# Patient Record
Sex: Male | Born: 1980
Health system: Southern US, Community
[De-identification: ages and names within clinical notes are randomized; demographics above are authoritative.]

## PROBLEM LIST (undated history)

## (undated) DIAGNOSIS — K219 Gastro-esophageal reflux disease without esophagitis: Secondary | ICD-10-CM

---

## 2002-04-22 ENCOUNTER — Ambulatory Visit (HOSPITAL_COMMUNITY): Admission: RE | Admit: 2002-04-22 | Discharge: 2002-04-22 | Payer: Self-pay | Admitting: Family Medicine

## 2002-04-22 ENCOUNTER — Encounter: Payer: Self-pay | Admitting: Family Medicine

## 2002-09-23 ENCOUNTER — Encounter: Payer: Self-pay | Admitting: Family Medicine

## 2002-09-23 ENCOUNTER — Ambulatory Visit (HOSPITAL_COMMUNITY): Admission: RE | Admit: 2002-09-23 | Discharge: 2002-09-23 | Payer: Self-pay | Admitting: Family Medicine

## 2003-12-13 ENCOUNTER — Ambulatory Visit (HOSPITAL_COMMUNITY): Admission: RE | Admit: 2003-12-13 | Discharge: 2003-12-13 | Payer: Self-pay | Admitting: General Surgery

## 2006-04-09 ENCOUNTER — Encounter (HOSPITAL_COMMUNITY): Admission: RE | Admit: 2006-04-09 | Discharge: 2006-05-09 | Payer: Self-pay | Admitting: Surgery

## 2006-04-09 ENCOUNTER — Encounter: Admission: RE | Admit: 2006-04-09 | Discharge: 2006-07-08 | Payer: Self-pay | Admitting: Surgery

## 2006-12-14 ENCOUNTER — Encounter: Admission: RE | Admit: 2006-12-14 | Discharge: 2007-03-14 | Payer: Self-pay | Admitting: Surgery

## 2006-12-16 HISTORY — PX: LAPAROSCOPIC GASTRIC BYPASS: SUR771

## 2007-01-04 ENCOUNTER — Inpatient Hospital Stay (HOSPITAL_COMMUNITY): Admission: RE | Admit: 2007-01-04 | Discharge: 2007-01-06 | Payer: Self-pay | Admitting: Surgery

## 2007-01-04 ENCOUNTER — Ambulatory Visit: Payer: Self-pay | Admitting: Surgery

## 2007-02-24 ENCOUNTER — Encounter: Admission: RE | Admit: 2007-02-24 | Discharge: 2007-02-25 | Payer: Self-pay | Admitting: Surgery

## 2010-07-30 NOTE — Op Note (Signed)
NAMECAMDEN, [name] NO.:  [id number]   MEDICAL RECORD NO.:  [id number]          PATIENT TYPE:  INP   LOCATION:  1525                         FACILITY:  [facility]   PHYSICIAN:  Sandria Bales. Ezzard Standing, M.D.  DATE OF BIRTH:  July 11, 1980   DATE OF PROCEDURE:  01/04/2007  DATE OF DISCHARGE:                               OPERATIVE REPORT   PREOPERATIVE DIAGNOSIS:  Morbid obesity weight 258, BMI of 50.   POSTOPERATIVE DIAGNOSIS:  Morbid obesity weight 258, BMI of 50.   PROCEDURES:  Laparoscopic Roux-en-Y gastric bypass.   SURGEON:  Dr. Ezzard Standing.   FIRST ASSISTANT:  Sharlet Salina T. Hoxworth, M.D.   ANESTHESIA:  General endotracheal.   ESTIMATED BLOOD LOSS:  Minimal.   INDICATIONS FOR PROCEDURE:  [name] is a 30 year old white male  who initially saw me earlier this year.  His initial weight was 396 with  a BMI of 55.5.  I told him he needed to lose 25-30 pounds before being  considered for surgery.  He was able to get his weight down to 358 with  a BMI of 50.1 and now comes for attempted laparoscopic Roux-en-Y gastric  bypass.  His primary care doctor is Dr. Assunta Found.   I have discussed with him the indications, potential complications  surgery.  Potential complications include but not limited to bleeding,  infection, bowel leak, deep venous thrombosis/pulmonary embolism and  long-term nutritional consequences.   I think he understands the procedure.   OPERATIVE NOTE:  Patient placed in a supine position, given general  endotracheal anesthetic.  He had a Foley catheter in place.  His abdomen  was shaved, prepped with Betadine solution and sterilely draped.  He is  given a gram of cefoxitin initiation procedure.   I accessed the abdominal cavity with an Ethicon OptiVue in the left  upper quadrant and placed a 12-mm OptiVue into the abdomen that  difficulty.  I then place six additional trocars of 5 mm of the  subxiphoid location, the liver retractor, a 12-mm right  subcostal, a 12  mm right paramedian trocar, a 12 mm left paramedian trocar and a 5 mm  left lateral left subcostal trocar and then the right 10 mm right to the  umbilicus.   Abdominal exploration was carried out.  Right left lobes of liver  unremarkable.  The anterior wall of the stomach that I could see was  unremarkable.  The bowel that I could see was unremarkable.   I started out with identification the ligament Treitz.  I counted 35 cm  to 40 cm beyond this and divided the small bowel.  I then counted 100 cm  for the future gastric limb.  I placed a Penrose drain tagging this  limb.  I then did a side-to-side small bowel to small bowel anastomosis  using a white load of the Endo-GIA 45 stapler.  I closed the enterotomy  with two running 2-0 Vicryl sutures.  I then closed the mesenteric  defect with a running 2-0 silk suture.  I placed Tisseel over the  enteroenterostomy.   I then turn attention to the upper  abdomen, identified the ligament of  Treitz.  He did have a small hiatal hernia in which some of the stomach  slid up along the left crus.  This was fairly small and easily reducible  and I thought we could go ahead with the procedure is planned.   I then opened up along the greater curvature about 4 to 5 cm below the  gastroesophageal junction got into the lesser sac.  I did a single  firing of the 45 blue Endo GI stapler and I did three firings of the 60  blue Endo GI stapler forming a pouch about 4 to 5 cm length and 4 cm in  width, the pouch was a little wider than I liked normally but actually  otherwise looked fine.  I oversewed the distal gastric stump with a  running locking 2-0 Vicryl suture with the Laparoties on both ends.   I then brought the small bowel up antegastric antecolic and did a  anastomosis between the gastric pouch and the jejunum up.  I did a  posterior running #2-0 Vicryl suture, hand tied one end and put a  Laparotie on the other end.   I placed  in the Ewald tube down which I advanced and identified and made  an enterotomy into the stomach, enterotomy to the small bowel and did a  stapled side-to-side anastomosis with a 45 blue Endo-GIA stapler tried  to create anastomosis about 2 cm in size.   After this was completed I then closed the enterotomy two running 2-0  Vicryl sutures.  I then advanced the Ewald through the gastrojejunal  anastomosis and then anterior row of 2-0 Vicryl sutures.   At this point the anastomosis looked patent.  There is no evidence of  ischemia or bleeding.   Dr. Johna Sheriff broke scrub and went to the head and advanced the endoscope  down into the gastric pouch.  He identified the pouch with the GE  junction about 40 cm, the gastrojejunal anastomosis at about 45 cm.  There is no active bleeding.  The mucosa of the pouch looked viable.  He  insufflated the stomach pouch with air while I clamped off the end of  the jejunum and there was no air leak as I submersed this whole system  under water. He then sucked all the air out of the gastric pouch.  I  irrigated the upper abdomen.  I placed Tisseel at both the jejunojejunal  anastomosis and at the gastrojejunal anastomosis and over the end of the  stump of the jejunum.   At this point the bowel looked like it lay well, was healthy.  The  anastomosis looked intact.  I removed the liver retractor, removed all  the other trocars in turn.   I infiltrated the skin with 30 mL of 0.25% Marcaine.  Sponge and needle  count were correct at the end of the case.  I stapled the skin incisions  and the patient was then transported to the recovery room in good  condition.      Sandria Bales. Ezzard Standing, M.D.  Electronically Signed     DHN/MEDQ  D:  01/04/2007  T:  01/05/2007  Job:  [id number]   cc:   Corrie Mckusick, M.D.  Fax: [phone]   Fara Chute  Fax: [phone]   Margaretmary Bayley, M.D.  Fax: [phone]

## 2010-07-30 NOTE — Op Note (Signed)
NAMESHANAN, MCMILLER NO.:  [id number]   MEDICAL RECORD NO.:  [id number]          PATIENT TYPE:  INP   LOCATION:  1525                         FACILITY:  [facility]   PHYSICIAN:  Sharlet Salina T. Hoxworth, M.D.DATE OF BIRTH:  05-11-1980   DATE OF PROCEDURE:  01/04/2007  DATE OF DISCHARGE:                               OPERATIVE REPORT   PROCEDURE:  Upper GI endoscopy.   DESCRIPTION OF PROCEDURE:  Upper GI endoscopy is performed at the  completion of laparoscopic Roux-en-Y gastric bypass for morbid obesity  by Dr. Ovidio Kin.  At the completion of bypass procedure, the Olympus  video endoscope was inserted into the upper esophagus and then passed  under direct vision to the EG junction at 40 cm from the incisor.  The  esophagus appeared normal.  The small gastric pouch was entered.  There  is a small amount of old blood but no evidence of active bleeding.  Suture and staple lines appeared intact.  The anastomosis was visualized  and appeared patent.  The pouch was then tensely distended with air  while the outlet was clamped under saline by Dr. Ezzard Standing.  There was no  evidence of leakage.  The pouch measured 5 cm in length.  Following this  the air was decompressed and the scope withdrawn without difficulty.      Lorne Skeens. Hoxworth, M.D.  Electronically Signed     BTH/MEDQ  D:  01/04/2007  T:  01/05/2007  Job:  [id number]

## 2010-08-02 NOTE — H&P (Signed)
[name], ROSENSTEEL NO.:  [id number]   MEDICAL RECORD NO.:  [id number]           PATIENT TYPE:   LOCATION:                                 FACILITY:   PHYSICIAN:  Dalia Heading, M.D.  DATE OF BIRTH:  1981-02-21   DATE OF ADMISSION:  12/13/2003  DATE OF DISCHARGE:  LH                                HISTORY & PHYSICAL   CHIEF COMPLAINT:  Longstanding GERD.   HISTORY OF PRESENT ILLNESS:  This patient is a 30 year old white male who  was referred for endoscopic evaluation.  He needs EGD for longstanding  gastroesophageal reflux disease.  He has been on Nexium for many years.  If  he does not take it, his heartburn returns immediately.  No abdominal pain,  weight loss, nausea, vomiting, constipation, diarrhea, melena, hematochezia.  He has never had an EGD.  There is no family history of colon carcinoma.   PAST MEDICAL HISTORY:  Extrinsic allergies.   PAST SURGICAL HISTORY:  Unremarkable.   CURRENT MEDICATIONS:  Nexium, albuterol inhalers as needed.   ALLERGIES:  BENADRYL.   REVIEW OF SYSTEMS:  Noncontributory.   PHYSICAL EXAMINATION:  GENERAL:  The patient is a well-developed, well-  nourished white male in no acute distress.  VITAL SIGNS:  He is afebrile, and vital signs are stable.  LUNGS: Clear to auscultation with equal breath sounds bilaterally.  HEART:  Regular rate and rhythm without S3, S4, or murmurs.  ABDOMEN:  Soft, nontender, nondistended.  No hepatosplenomegaly or masses  are noted.   IMPRESSION:  Gastroesophageal reflux disease.   PLAN:  The patient is scheduled for a EGD on December 13, 2003.  The risks  and benefits of the procedure including bleeding and perforation were fully  explained to the patient, who gave informed consent.       ___________________________________________  Dalia Heading, M.D.    MAJ/MEDQ  D:  12/07/2003  T:  12/07/2003  Job:  [id number]   cc:   Corrie Mckusick, M.D.  [address] [place]. [place]  [place]  [place] [address]  Fax: [phone]

## 2010-12-25 LAB — DIFFERENTIAL
Basophils Absolute: 0
Basophils Absolute: 0
Basophils Relative: 0
Eosinophils Absolute: 0
Eosinophils Relative: 2
Lymphocytes Relative: 34
Lymphs Abs: 2.7
Neutro Abs: 5.5
Neutrophils Relative %: 54
Neutrophils Relative %: 63

## 2010-12-25 LAB — CBC
MCHC: 33.3
MCV: 79.6
Platelets: 204
Platelets: 220
RBC: 5.29
RDW: 14.8 — ABNORMAL HIGH
RDW: 14.8 — ABNORMAL HIGH
WBC: 8

## 2010-12-25 LAB — HEMOGLOBIN AND HEMATOCRIT, BLOOD
HCT: 40.5
HCT: 44.4
Hemoglobin: 13.6
Hemoglobin: 14.4
Hemoglobin: 15.1

## 2010-12-25 LAB — BASIC METABOLIC PANEL
CO2: 29
Calcium: 9.5
GFR calc Af Amer: >60
GFR calc non Af Amer: >60
Sodium: 141

## 2014-03-08 ENCOUNTER — Other Ambulatory Visit (INDEPENDENT_AMBULATORY_CARE_PROVIDER_SITE_OTHER): Payer: Self-pay

## 2014-03-08 DIAGNOSIS — Z9884 Bariatric surgery status: Secondary | ICD-10-CM

## 2014-03-08 DIAGNOSIS — K30 Functional dyspepsia: Secondary | ICD-10-CM

## 2014-03-15 ENCOUNTER — Ambulatory Visit (HOSPITAL_COMMUNITY)
Admission: RE | Admit: 2014-03-15 | Discharge: 2014-03-15 | Disposition: A | Discharge status: Home / Self Care | Payer: BC Managed Care – PPO | Source: Ambulatory Visit | Attending: Surgery | Admitting: Surgery

## 2014-03-15 DIAGNOSIS — R1013 Epigastric pain: Secondary | ICD-10-CM | POA: Insufficient documentation

## 2014-03-15 DIAGNOSIS — Z9884 Bariatric surgery status: Secondary | ICD-10-CM | POA: Diagnosis not present

## 2014-03-15 DIAGNOSIS — R933 Abnormal findings on diagnostic imaging of other parts of digestive tract: Secondary | ICD-10-CM | POA: Insufficient documentation

## 2014-03-15 DIAGNOSIS — R110 Nausea: Secondary | ICD-10-CM | POA: Insufficient documentation

## 2014-03-15 DIAGNOSIS — K219 Gastro-esophageal reflux disease without esophagitis: Secondary | ICD-10-CM | POA: Insufficient documentation

## 2014-03-15 DIAGNOSIS — K824 Cholesterolosis of gallbladder: Secondary | ICD-10-CM | POA: Insufficient documentation

## 2014-03-15 DIAGNOSIS — R160 Hepatomegaly, not elsewhere classified: Secondary | ICD-10-CM | POA: Insufficient documentation

## 2014-03-15 DIAGNOSIS — K5900 Constipation, unspecified: Secondary | ICD-10-CM | POA: Diagnosis present

## 2014-03-29 ENCOUNTER — Other Ambulatory Visit (INDEPENDENT_AMBULATORY_CARE_PROVIDER_SITE_OTHER): Payer: Self-pay | Admitting: Surgery

## 2014-05-15 NOTE — H&P (Signed)
[name] 03/08/2014 3:31 PM Location: [facility] Patient #: [id number] DOB: Feb 01, 1981 Married / Language: English / Race: White Male  History of Present Illness: Patient words: RNY 2008, wt loss issues.  The patient is a 34 year old male who presents for a bariatric surgery evaluation. His PCP is Dr. Angie Fava. He comes by himselft.  I did a RYGB on 01/04/2007 on him. His initial weight was 396 with a BMI of 55.2. At surgery he weighed 358 with BMI of 50. I last saw him on 08/05/2007 and his weight at that time ws 258 and his BMI was 36.1.  He comes to the office for several reasons. First, he has gained to about 270 pounds. His lowest weight that he measured was 225 pounds.  We talked about issues that he has. He does well with portion control. But he does late night snack and he likes sweats. Secondly, he was admitted to [facility] in February 2014 for vomiting. He underwent an upper endoscopy by an unknown gastroenterologist who said he had a hiatal hernia and esophageal ulcers.  He tried to take omeprazole, but is not taking it now. He has not seen the gastroenterologist back. He seemed a little focused on a hiatal hernia. I did note a minimal hiatal hernia at the time of surgery, but I did not think that it warrented getting repaired. Thirdly, he has symptoms of a lot of belching, burping, and reflux. He wonders whether the hiatal hernia is causing the symptoms.  He also raised the question of gallbladder disease. He had a preoperative ultrasound which did not show gallstones.  Interestlingly, he said he did act out some after his bypass surgery. He would only buy expensive clothes as he downsized. I did his wife's bypass surgery (Renee) about the same time. [name] may have had a different last name] She cheated on him, they divorced, and she has moved on to [place]. He has remarried.  He saw Dr. Cyndia Skeeters for pysch.  Past Medical  History: 1. hypothyroid - resolved He does not see Dr. Margaretmary Bayley any more for endocrine. He had thyroiditis, which has resolved. 2. Hypercholesterolemia 3. GERD 4. 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 He is now a Insurance account manager.  Social history: Married. He has a daughter 19 yo and a step daughter 99 yo. He is now a Insurance account manager. Addendum Note(Kaesha Kirsch H. Lothar Prehn MD; 03/15/2014 8:17 AM) Reports obtained from [facility] : 05/11/2012 - CT scan of abdomen/pelvis - "moderated sized esophageal HH. Wall thickening of distal esphagus and gastric pouch may represent infrlammatroy process." DN 03/14/2014  Addendum Note(Lorena Benham H. Ezzard Standing MD; 03/15/2014 8:22 AM) Notes from [facility] - 04/2012 admission - Dr. Teena Dunk was the gastroenterologist. I have his endoscopy report. He comments on "Grade C" reflux. Does not mention specifics of bypass. DN 03/14/2014  Addendum Note(Leyanna Bittman H. Jayveon Convey MD; 03/21/2014 11:17 AM) [place] of abdomen - has gall bladder polyp, no GB wall changes - 03/08/2014 UGI - Distal esophageal irregularity, but no obvious HH. Gastric pouch size about right. Will need direct visualization - 03/08/2014 I see him back later this month. DN 03/21/2014  Addendum Note(Dell Briner H. Ezzard Standing MD; 03/29/2014 2:25 PM) I spoke to the patient about the findings on UGI and [place]. I called in Protonix - 20 mg BID (#60) - to [facility] - [phone] I will schedule an upper endo to look at the distal esophagus and  gastric pouch. He does not need to keep next week's appt. DN 03/29/2014  Addendum Note(Daphane Odekirk H. Vedha Tercero MD; 04/18/2014 3:46 PM) He called and decided not to have the upper endo. I woudl like to see him in 4 to 6 months, if he would let us. DN 04/18/2014   Other Problems (Ammie Eversole, LPN; 16/10/960412/23/2015 3:31 PM) Asthma Gastroesophageal Reflux Disease Thyroid Disease  Past Surgical  History (Ammie Eversole, LPN; 54/09/811912/23/2015 3:31 PM) Gastric Bypass Resection of Stomach Vasectomy  Diagnostic Studies History (Ammie Eversole, LPN; 14/78/295612/23/2015 3:31 PM) Colonoscopy never  Allergies (Ammie Eversole, LPN; 21/30/865712/23/2015 3:32 PM) No Known Drug Allergies12/23/2015  Medication History (Ammie Eversole, LPN; 84/69/629512/23/2015 3:32 PM) Vitamin B12 (100MCG Tablet, Oral) Active.  Social History (Ammie Eversole, LPN; 28/41/324412/23/2015 3:31 PM) Alcohol use Occasional alcohol use. Caffeine use Coffee, Tea. No drug use Tobacco use Never smoker.  Family History (Deon Pillingmmie Eversole, LPN; 01/02/725312/23/2015 3:31 PM) Heart disease in male family member before age 33 Hypertension Mother.  Review of Systems (Ammie Eversole LPN; 66/44/034712/23/2015 3:31 PM) General Present- Fatigue and Weight Gain. Not Present- Appetite Loss, Chills, Fever, Night Sweats and Weight Loss. Skin Not Present- Change in Wart/Mole, Dryness, Hives, Jaundice, New Lesions, Non-Healing Wounds, Rash and Ulcer. HEENT Not Present- Earache, Hearing Loss, Hoarseness, Nose Bleed, Oral Ulcers, Ringing in the Ears, Seasonal Allergies, Sinus Pain, Sore Throat, Visual Disturbances, Wears glasses/contact lenses and Yellow Eyes. Respiratory Not Present- Bloody sputum, Chronic Cough, Difficulty Breathing, Snoring and Wheezing. Breast Not Present- Breast Mass, Breast Pain, Nipple Discharge and Skin Changes. Cardiovascular Present- Leg Cramps. Not Present- Chest Pain, Difficulty Breathing Lying Down, Palpitations, Rapid Heart Rate, Shortness of Breath and Swelling of Extremities. Gastrointestinal Present- Abdominal Pain, Change in Bowel Habits, Constipation, Difficulty Swallowing and Indigestion. Not Present- Bloating, Bloody Stool, Chronic diarrhea, Excessive gas, Gets full quickly at meals, Hemorrhoids, Nausea, Rectal Pain and Vomiting. Male Genitourinary Not Present- Blood in Urine, Change in Urinary Stream, Frequency, Impotence, Nocturia, Painful  Urination, Urgency and Urine Leakage. Musculoskeletal Present- Joint Pain. Not Present- Back Pain, Joint Stiffness, Muscle Pain, Muscle Weakness and Swelling of Extremities. Neurological Present- Tingling. Not Present- Decreased Memory, Fainting, Headaches, Numbness, Seizures, Tremor, Trouble walking and Weakness. Endocrine Not Present- Cold Intolerance, Excessive Hunger, Hair Changes, Heat Intolerance, Hot flashes and New Diabetes. Hematology Not Present- Easy Bruising, Excessive bleeding, Gland problems, HIV and Persistent Infections.   Vitals (Ammie Eversole LPN; 42/59/563812/23/2015 3:33 PM) 03/08/2014 3:32 PM Weight: 270.4 lb Height: 70.25in Body Surface Area: 2.47 m Body Mass Index: 38.52 kg/m Temp.: 98.52F(Oral)  Pulse: 69 (Regular)  Resp.: 16 (Unlabored)  BP: 124/82 (Sitting, Left Arm, Standard)  Physical Exam: General: WN moderately overweight M alert and generally healthy appearing. HEENT: Normal. Pupils equal. Good dentition.  Neck: Supple. No mass. No thyroid mass. Lymph Nodes: No supraclavicular or cervical nodes.  Lungs: Clear to auscultation and symmetric breath sounds. Heart: RRR. No murmur or rub.  Abdomen: Soft. No mass. No tenderness. No hernia. Normal bowel sounds. Scars from the RYGB. He has excess skin. Rectal: Not done.  Extremities: Good strength and ROM in upper and lower extremities.  Neurologic: Grossly intact to motor and sensory function. Psychiatric: Has normal mood and affect. Behavior is normal.   Assessment & Plan: 1.  HISTORY OF ROUX-EN-Y GASTRIC BYPASS (V45.86  Z98.84)  Story: RYGB on 01/04/2007 by Dr. Algis Downs. Cameren Earnest  Initial weight - 396 and BMI 55.22.  2.  INDIGESTION (536.8  K30) Impression: Plan - Get records/labs from Dr. Neita CarpSasser  Get records from [facility] - Feb 2014 - particularly record of prior upper endo.  Obtain UGI and [place] of upper abdomen  See him back in 3 to 4 weeks to review all these labs. Current  Plans:  Follow up in 1 month or as needed  Ovidio Kin, MD, [facility] Pager: [phone] Office phone:  [phone]

## 2014-05-16 ENCOUNTER — Encounter (HOSPITAL_COMMUNITY)
Admission: RE | Disposition: A | Discharge status: Home / Self Care | Payer: Self-pay | Source: Ambulatory Visit | Attending: Surgery

## 2014-05-16 ENCOUNTER — Encounter (HOSPITAL_COMMUNITY): Payer: Self-pay | Admitting: Gastroenterology

## 2014-05-16 ENCOUNTER — Ambulatory Visit (HOSPITAL_COMMUNITY)
Admission: RE | Admit: 2014-05-16 | Discharge: 2014-05-16 | Disposition: A | Discharge status: Home / Self Care | Payer: BLUE CROSS/BLUE SHIELD | Source: Ambulatory Visit | Attending: Surgery | Admitting: Surgery

## 2014-05-16 DIAGNOSIS — Z79899 Other long term (current) drug therapy: Secondary | ICD-10-CM | POA: Insufficient documentation

## 2014-05-16 DIAGNOSIS — Z9889 Other specified postprocedural states: Secondary | ICD-10-CM | POA: Insufficient documentation

## 2014-05-16 DIAGNOSIS — M1990 Unspecified osteoarthritis, unspecified site: Secondary | ICD-10-CM | POA: Insufficient documentation

## 2014-05-16 DIAGNOSIS — Z9884 Bariatric surgery status: Secondary | ICD-10-CM | POA: Diagnosis not present

## 2014-05-16 DIAGNOSIS — E780 Pure hypercholesterolemia: Secondary | ICD-10-CM | POA: Diagnosis not present

## 2014-05-16 DIAGNOSIS — K449 Diaphragmatic hernia without obstruction or gangrene: Secondary | ICD-10-CM | POA: Diagnosis not present

## 2014-05-16 DIAGNOSIS — K210 Gastro-esophageal reflux disease with esophagitis: Secondary | ICD-10-CM | POA: Insufficient documentation

## 2014-05-16 DIAGNOSIS — J45909 Unspecified asthma, uncomplicated: Secondary | ICD-10-CM | POA: Diagnosis not present

## 2014-05-16 DIAGNOSIS — R1013 Epigastric pain: Secondary | ICD-10-CM | POA: Diagnosis present

## 2014-05-16 HISTORY — DX: Gastro-esophageal reflux disease without esophagitis: K21.9

## 2014-05-16 HISTORY — PX: ESOPHAGOGASTRODUODENOSCOPY: SHX5428

## 2014-05-16 SURGERY — EGD (ESOPHAGOGASTRODUODENOSCOPY)
Anesthesia: Moderate Sedation

## 2014-05-16 MED ORDER — MIDAZOLAM HCL 10 MG/2ML IJ SOLN
INTRAMUSCULAR | Status: DC | PRN
  Administered 2014-05-16 (×2): 2 mg via INTRAVENOUS

## 2014-05-16 MED ORDER — BUTAMBEN-TETRACAINE-BENZOCAINE 2-2-14 % EX AERO
INHALATION_SPRAY | CUTANEOUS | Status: DC | PRN
  Administered 2014-05-16: 2 via TOPICAL

## 2014-05-16 MED ORDER — FENTANYL CITRATE 0.05 MG/ML IJ SOLN
INTRAMUSCULAR | Status: AC
  Filled 2014-05-16: qty 2

## 2014-05-16 MED ORDER — FENTANYL CITRATE 0.05 MG/ML IJ SOLN
INTRAMUSCULAR | Status: DC | PRN
  Administered 2014-05-16 (×2): 25 ug via INTRAVENOUS

## 2014-05-16 MED ORDER — MIDAZOLAM HCL 10 MG/2ML IJ SOLN
INTRAMUSCULAR | Status: AC
  Filled 2014-05-16: qty 2

## 2014-05-16 MED ORDER — SODIUM CHLORIDE 0.9 % IV SOLN: INTRAVENOUS | Status: DC

## 2014-05-16 NOTE — Interval H&P Note (Signed)
History and Physical Interval Note:  05/16/2014 1:03 PM  [name]  has presented today for surgery, with the diagnosis of gastric reflux, abnormal distal esophagus, s/p RYGB  The various methods of treatment have been discussed with the patient and family.  His wife [name] is with him.  The protonix he has started has helped his reflux symptoms about 75%.  After consideration of risks, benefits and other options for treatment, the patient has consented to  Procedure(s): ESOPHAGOGASTRODUODENOSCOPY (EGD) (N/A) as a surgical intervention .  The patient's history has been reviewed, patient examined, no change in status, stable for surgery.  I have reviewed the patient's chart and labs.  Questions were answered to the patient's satisfaction.     Kaniel Kiang H

## 2014-05-16 NOTE — Discharge Instructions (Addendum)
Esophagogastroduodenoscopy °Care After °Refer to this sheet in the next few weeks. These instructions provide you with information on caring for yourself after your procedure. Your caregiver may also give you more specific instructions. Your treatment has been planned according to current medical practices, but problems sometimes occur. Call your caregiver if you have any problems or questions after your procedure.  °HOME CARE INSTRUCTIONS °· Do not eat or drink anything until the numbing medicine (local anesthetic) has worn off and your gag reflex has returned. You will know that the local anesthetic has worn off when you can swallow comfortably. °· Do not drive for 24 hours after the procedure or as directed by your caregiver. °· Only take medicines as directed by your caregiver. °SEEK MEDICAL CARE IF:  °· You cannot stop coughing. °· You are not urinating at all or less than usual. °SEEK IMMEDIATE MEDICAL CARE IF: °· You have difficulty swallowing. °· You cannot eat or drink. °· You have worsening throat or chest pain. °· You have dizziness, lightheadedness, or you faint. °· You have nausea or vomiting. °· You have chills. °· You have a fever. °· You have severe abdominal pain. °· You have black, tarry, or bloody stools. °Document Released: 02/18/2012 Document Reviewed: 02/18/2012 °ExitCare® Patient Information ©2015 [employer]. This information is not intended to replace advice given to you by your health care provider. Make sure you discuss any questions you have with your health care provider. ° °

## 2014-05-16 NOTE — Op Note (Signed)
05/16/2014  3:52 PM  PATIENT:  [name], 34 y.o., male, MRN: [id number]  PREOP DIAGNOSIS:  History of RYGB, History of esophagitis  POSTOP DIAGNOSIS:   Distal esophagitis, small hiatal hernia, normal anatomy post gastric bypass  PROCEDURE:  Esophagogastrojejunoscopy  SURGEON:   Ovidio Kinavid Fortino Haag, M.D.  ANESTHESIA:   Fentanyl  50 mcg   Versed 4 mg  INDICATIONS FOR PROCEDURE:  [name] is a 34 y.o. (DOB: 06/27/1980)  white  male whose primary care physician is Estanislado PandySASSER,PAUL W, MD and comes for upper endoscopy to evaluate dyspepsia and trouble with certain foods.  The patient had a RYGB on 01/04/2007 by Dr. Algis Downs. Darreld Hoffer.    He [facility] for vomiting. He underwent an upper endoscopy by an Dr. Teena DunkBenson who said he had a hiatal hernia and esophageal ulcers    I started him on Protonix about 4 weeks ago.  He said it has helped about 75% of his symptoms.   The indications and risks of the endoscopy were explained to the patient.  The risks include, but are not limited to, perforation, bleeding, or injury to the bowel.  If balloon dilatation is needed, the risk of perforation is higher.  PROCEDURE:  The [facility].  The patient was monitored with a pulse oximetry, BP cuff, and EKG.  The patient has nasal O2 flowing during the procedure.   The back of the throat was anesthestized with Ceticaine x 3.  The patient was positioned in the left lateral decubitus position.  The patient was given Fentanyl and Versed.  A flexible Pentax endoscope was passed down the throat without difficulty.   Findings include:   Esophagus:   Normal except distal esophagus.  He has evidence of esophagitis/ulcers.  These actually look like they are trying to heal.  I did 3 biopsies of the distal esophagus   GE junction at:  36 cm.  With the scope retroflexed, he appears to have a small hiatal hernia -about 2 cm.   Stomach pouch:  Normal.   Gastrojejunal anastomosis:   43 cm.  Widely open.  No ulcer.   Efferent jejunal limb:  Normal to 20 cm. Afferent jejunal limb:  Normal.   CLO test:  Pending. I biopsied the stomach for CLO.  PLAN:   Photos taken and given to patient.    The endoscopic findings are consistent with esophagitis and reflux.  I do not see anything wrong with his gastric pouch or anastomosis.   The current treatment plan with Protonix BID is correct.  I want to see him back in the office in about 3 or 4 months.  I would consider repeat endoscopy in 6 months - depending on the final path and his symptoms.   His wife was at his bedside after the endoscopy.  Ovidio Kinavid Doloris Servantes, MD, [facility] Pager: [phone] Office phone:  [phone]

## 2014-05-17 ENCOUNTER — Encounter (HOSPITAL_COMMUNITY): Payer: Self-pay | Admitting: Surgery

## 2014-05-17 LAB — CLOTEST (H. PYLORI), BIOPSY: Helicobacter screen: NEGATIVE

## 2015-03-03 IMAGING — US US ABDOMEN COMPLETE
1 series · 14 of 25 positions shown · non-contrast
Comparison: Ultrasound dated 04/10/2006

CLINICAL DATA: Suggesting.  Previous gastric bypass appear

EXAM:
ULTRASOUND ABDOMEN COMPLETE

[Series 1: us abdomen complete · 0.22mm/px · 14 of 126 slices shown]
[im 1/126]
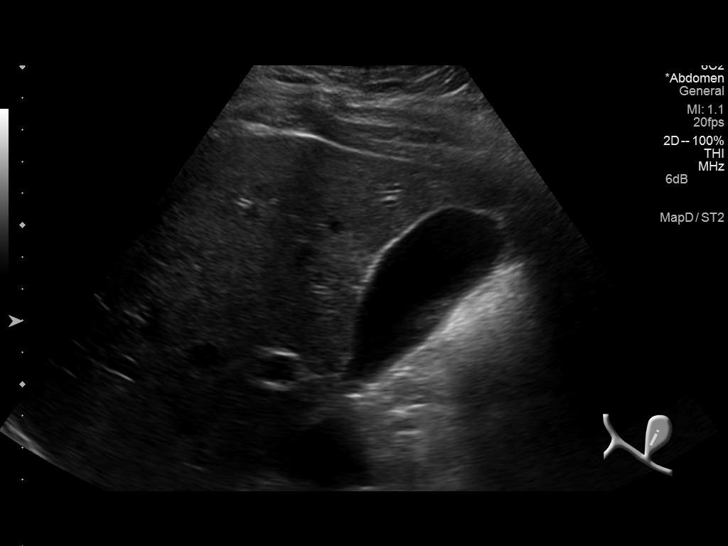
[im 11/126]
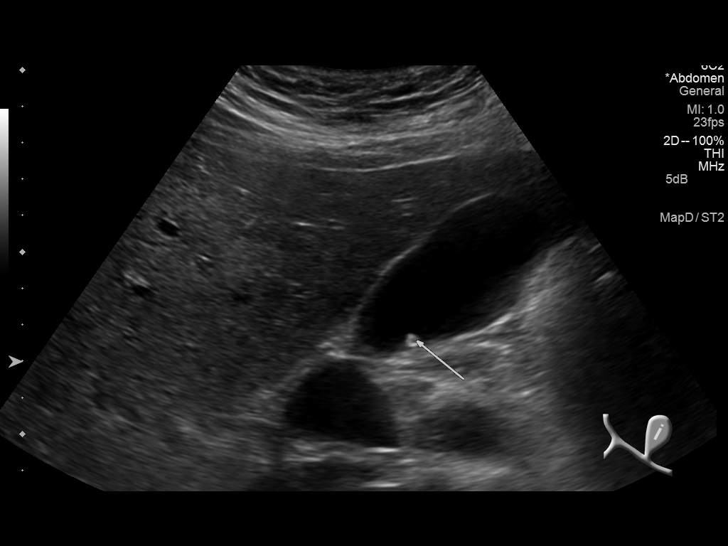
[im 21/126]
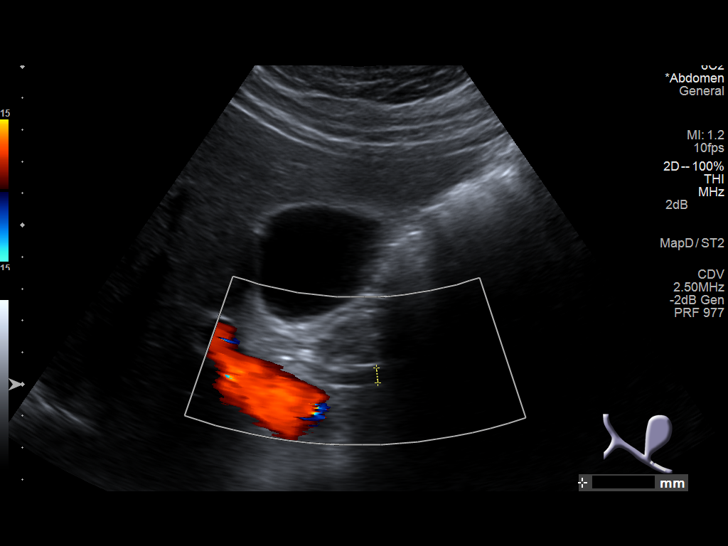
[im 32/126]
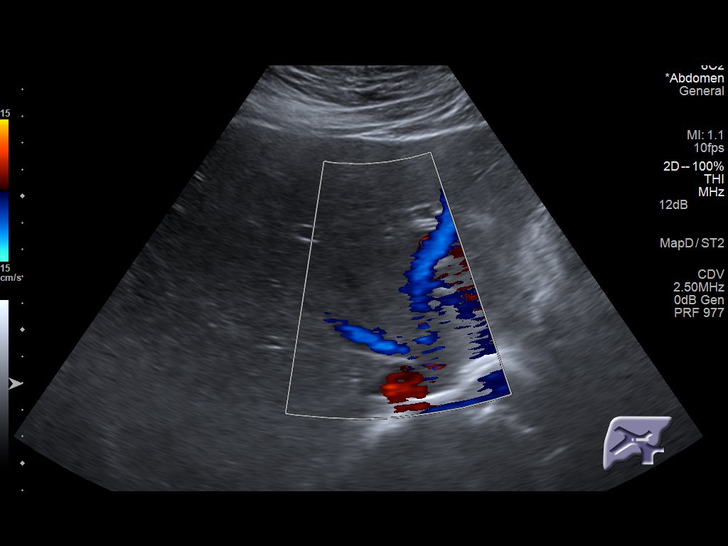
[im 42/126]
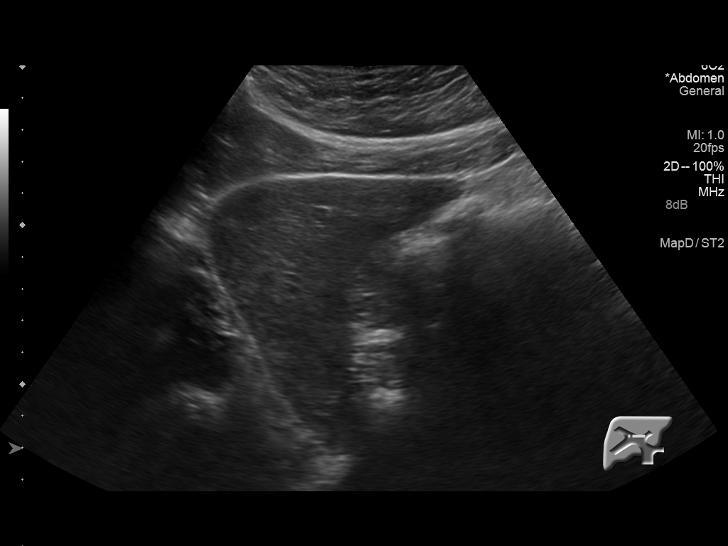
[im 47/126]
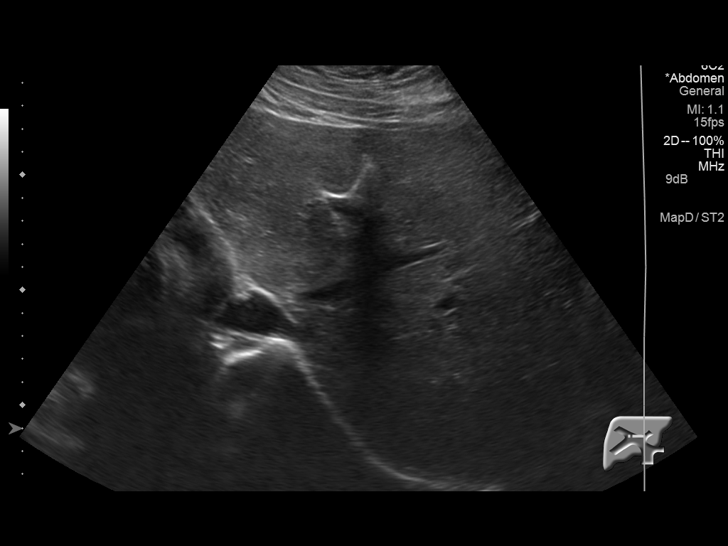
[im 58/126]
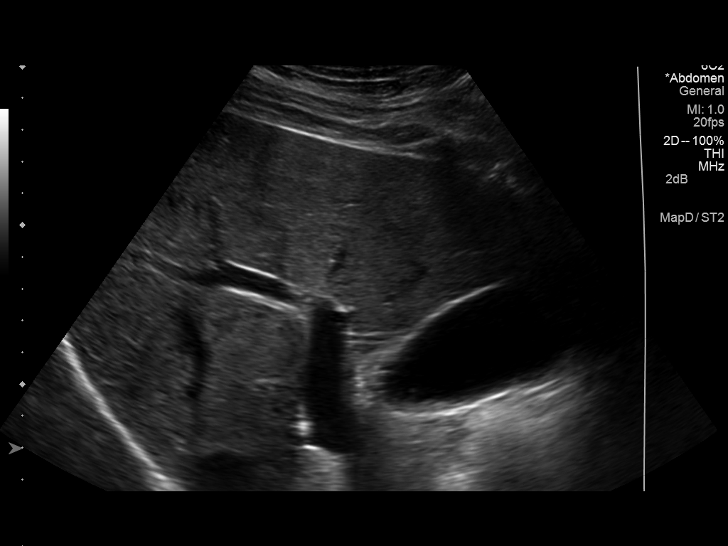
[im 68/126]
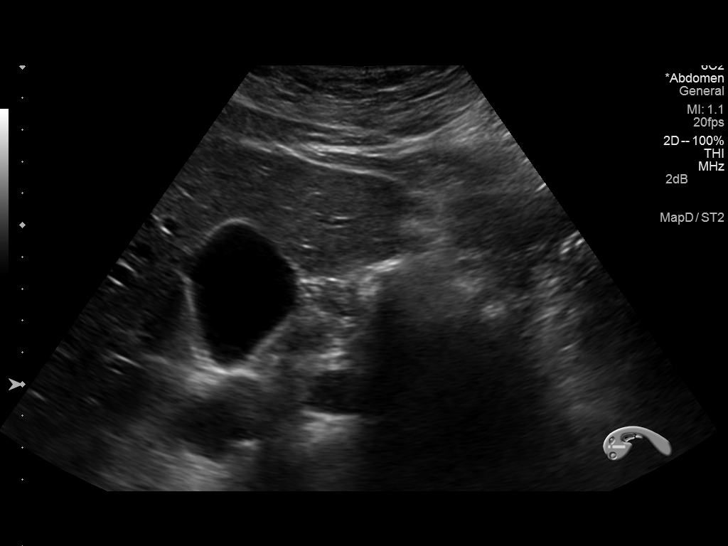
[im 79/126]
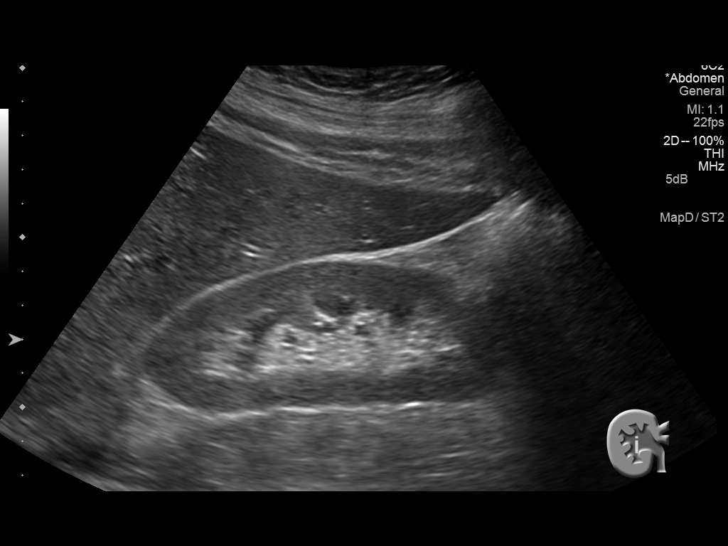
[im 84/126]
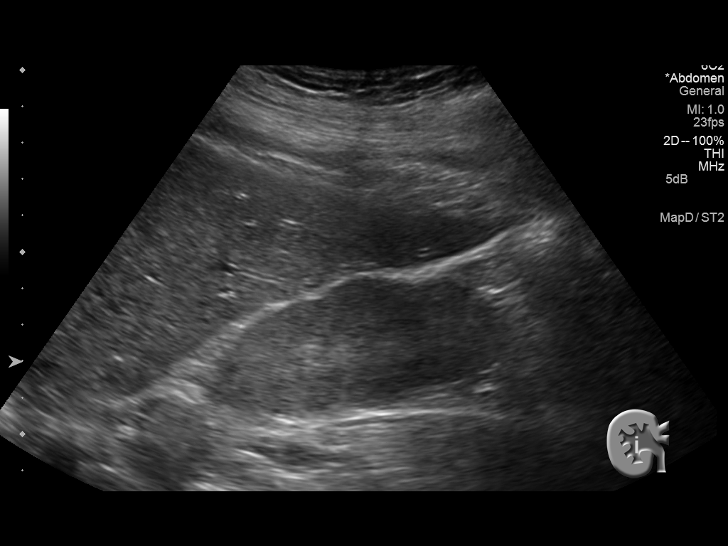
[im 94/126]
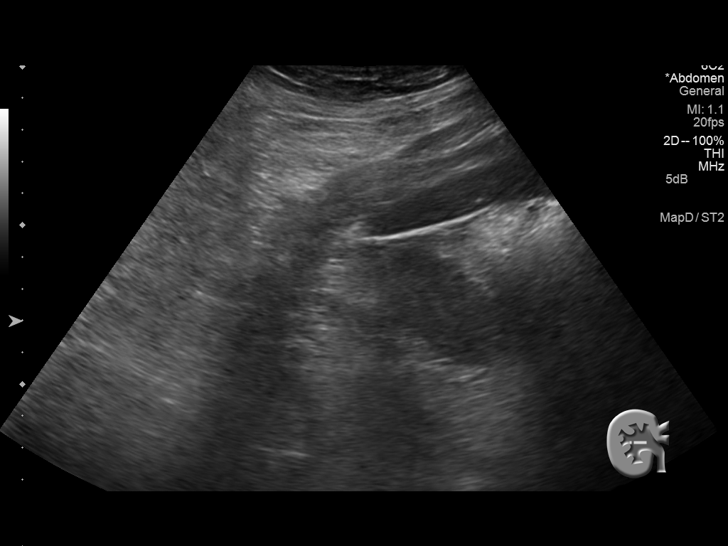
[im 105/126]
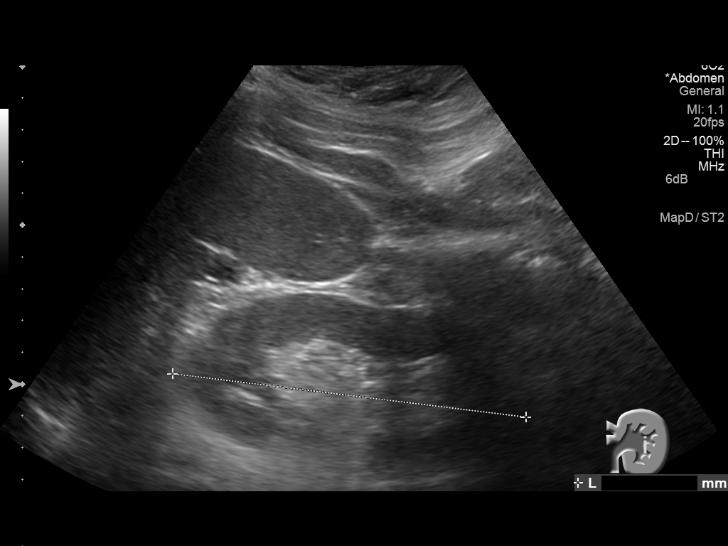
[im 115/126]
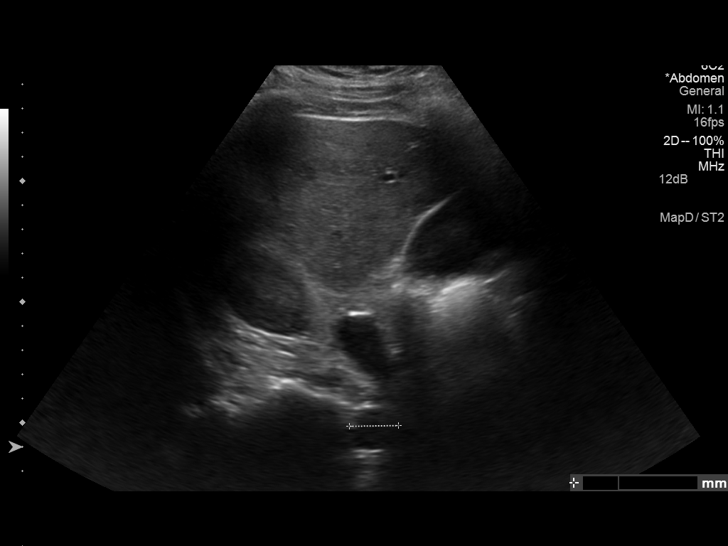
[im 126/126]
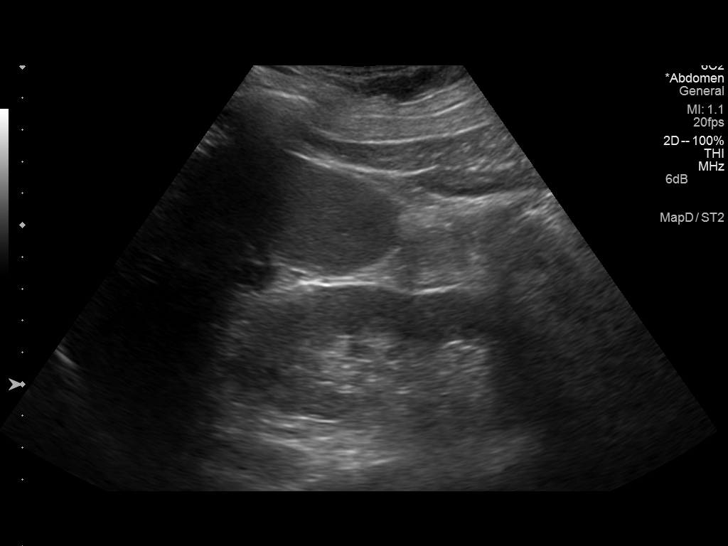

[14 of 25 positions shown; findings below may reference images not displayed]

FINDINGS: Gallbladder: There is a single 5 mm polyp in the gallbladder. The
wall is not thickened. Negative sonographic Murphy's sign.

Common bile duct: Diameter: 4.7 mm, normal.

Liver: There is slight hepatomegaly. No focal lesions. Normal
parenchymal echogenicity.

IVC: No abnormality visualized.

Pancreas: The body of the pancreas is normal. The tail and head are
obscured.

Spleen: 11.5 cm in length.  Splenic volume is 359 cc.

Right Kidney: Length: 11.2 cm. Echogenicity within normal limits. No
mass or hydronephrosis visualized.

Left Kidney: Length: 11.2 cm. Echogenicity within normal limits. No
mass or hydronephrosis visualized.

Abdominal aorta: No aneurysm visualized.  2.5 cm maximal diameter.

Other findings: None.
IMPRESSION: Single small polyp in the gallbladder. Slight hepatomegaly. Head and
tail of the pancreas are obscured.

## 2016-06-27 ENCOUNTER — Ambulatory Visit (INDEPENDENT_AMBULATORY_CARE_PROVIDER_SITE_OTHER): Payer: 59

## 2016-06-27 ENCOUNTER — Ambulatory Visit (INDEPENDENT_AMBULATORY_CARE_PROVIDER_SITE_OTHER): Payer: 59 | Admitting: Podiatry

## 2016-06-27 DIAGNOSIS — M722 Plantar fascial fibromatosis: Secondary | ICD-10-CM

## 2016-06-27 DIAGNOSIS — M779 Enthesopathy, unspecified: Secondary | ICD-10-CM | POA: Diagnosis not present

## 2016-06-27 DIAGNOSIS — M79675 Pain in left toe(s): Secondary | ICD-10-CM

## 2016-06-27 DIAGNOSIS — I739 Peripheral vascular disease, unspecified: Secondary | ICD-10-CM | POA: Diagnosis not present

## 2016-06-27 DIAGNOSIS — I7389 Other specified peripheral vascular diseases: Secondary | ICD-10-CM | POA: Diagnosis not present

## 2016-06-27 DIAGNOSIS — Z1389 Encounter for screening for other disorder: Secondary | ICD-10-CM | POA: Diagnosis not present

## 2016-06-27 DIAGNOSIS — K219 Gastro-esophageal reflux disease without esophagitis: Secondary | ICD-10-CM | POA: Diagnosis not present

## 2016-06-27 NOTE — Patient Instructions (Signed)
Pre-Operative Instructions  Congratulations, you have decided to take an important step to improving your quality of life.  You can be assured that the doctors of [facility] will be with you every step of the way.  1. Plan to be at the surgery center/hospital at least 1 (one) hour prior to your scheduled time unless otherwise directed by the surgical center/hospital staff.  You must have a responsible adult accompany you, remain during the surgery and drive you home.  Make sure you have directions to the surgical center/hospital and know how to get there on time. 2. For hospital based surgery you will need to obtain a history and physical form from your family physician within 1 month prior to the date of surgery- we will give you a form for you primary physician.  3. We make every effort to accommodate the date you request for surgery.  There are however, times where surgery dates or times have to be moved.  We will contact you as soon as possible if a change in schedule is required.   4. No Aspirin/Ibuprofen for one week before surgery.  If you are on aspirin, any non-steroidal anti-inflammatory medications (Mobic, Aleve, Ibuprofen) you should stop taking it 7 days prior to your surgery.  You make take Tylenol  For pain prior to surgery.  5. Medications- If you are taking daily heart and blood pressure medications, seizure, reflux, allergy, asthma, anxiety, pain or diabetes medications, make sure the surgery center/hospital is aware before the day of surgery so they may notify you which medications to take or avoid the day of surgery. 6. No food or drink after midnight the night before surgery unless directed otherwise by surgical center/hospital staff. 7. No alcoholic beverages 24 hours prior to surgery.  No smoking 24 hours prior to or 24 hours after surgery. 8. Wear loose pants or shorts- loose enough to fit over bandages, boots, and casts. 9. No slip on shoes, sneakers are best. 10. Bring  your boot with you to the surgery center/hospital.  Also bring crutches or a walker if your physician has prescribed it for you.  If you do not have this equipment, it will be provided for you after surgery. 11. If you have not been contracted by the surgery center/hospital by the day before your surgery, call to confirm the date and time of your surgery. 12. Leave-time from work may vary depending on the type of surgery you have.  Appropriate arrangements should be made prior to surgery with your employer. 13. Prescriptions will be provided immediately following surgery by your doctor.  Have these filled as soon as possible after surgery and take the medication as directed. 14. Remove nail polish on the operative foot. 15. Wash the night before surgery.  The night before surgery wash the foot and leg well with the antibacterial soap provided and water paying special attention to beneath the toenails and in between the toes.  Rinse thoroughly with water and dry well with a towel.  Perform this wash unless told not to do so by your physician.  Enclosed: 1 Ice pack (please put in freezer the night before surgery)   1 Hibiclens skin cleaner   Pre-op Instructions  If you have any questions regarding the instructions, do not hesitate to call our office.  [place]: [address] , [place] [address] [phone]  [place]: [address] [place], [place] [address] [phone]  [place]: [address]  [place], [place] [address] [phone]   Dr.   Mariena Meares DPM, Dr. Matthew Wagoner DPM, Dr. M. Todd Hyatt DPM, Dr. Titorya Stover DPM 

## 2016-06-29 NOTE — Progress Notes (Signed)
Subjective:     Patient ID: [name], male   DOB: 09-28-1980, 36 y.o.   MRN: [id number]  HPI patient presents with chronic pain in the right plantar heel that is been injected numerous times and is had inserts and he's tried different treatment plans without relief of symptoms. Also has pain in the second joint of the left foot that may be due to difference in gait   Review of Systems  All other systems reviewed and are negative.      Objective:   Physical Exam  Constitutional: He is oriented to person, place, and time.  Cardiovascular: Intact distal pulses.   Musculoskeletal: Normal range of motion.  Neurological: He is oriented to person, place, and time.  Skin: Skin is warm.  Nursing note and vitals reviewed.  neurovascular status intact muscle strength was adequate range of motion within normal limits with patient having discomfort which is mostly in the plantar center and plantar lateral heel with mild to moderate discomfort in the medial heel. It does appear to involve the entire heel complex in the plantar fascial insertion and again has been present for around 5 years and he can simply not do activities he wants to do or be active or be able to enjoy his daughter. Moderate discomfort in the second MPJ left     Assessment:     Chronic heel pain right of 5 years duration with dietary to respond to numerous conservative treatments and inflammation of the second MPJ left foot    Plan:     H&P condition reviewed treatment options discussed. Do to 5 year history of this I did discuss either shockwave or surgical intervention is he has opted for surgery due to the discomfort is had and the length of time she's had it. At this point him to read a consent form reviewing alternative treatments complications and the fact that there is absolutely no long-term guarantees that this will solve his problem that there is also the possibility it could be neurological and if this does not  solve it that may have to be looked into. Patient understands this completely and understands all complications the fact recovery can take 6 months to one year and signs consent form is given all preoperative instructions and had air fracture walker dispensed. Also I discussed injecting the left second MPJ at the same time

## 2016-07-01 ENCOUNTER — Telehealth: Payer: Self-pay | Admitting: *Deleted

## 2016-07-01 MED FILL — PHENTERMINE 37.5 MG TABLET: 37.5 | 30 days supply | Qty: 30 | Fill #0

## 2016-07-01 MED FILL — OMEPRAZOLE DR 40 MG CAPSULE: 40 | 30 days supply | Qty: 60 | Fill #0

## 2016-07-01 NOTE — Telephone Encounter (Signed)
"  I was in there on Friday and we scheduled my foot surgery on May 1.  I need to reschedule that because my wife has a set calendar, she works for [employer].  So I need to reschedule surgery to fit her schedule because I would like her to be there.  Give me a call back."

## 2016-07-02 NOTE — Telephone Encounter (Signed)
"  I was there the other day.  I left you a message that I needed to reschedule my surgery.  I guess you have been busy since you haven't called me back."   Do you have a date in mind?  "I can still do it in May.  Does he have anything on May 15?"  Yes, I'll get your surgery rescheduled to 07/29/2016.

## 2016-07-03 ENCOUNTER — Telehealth: Payer: Self-pay | Admitting: *Deleted

## 2016-07-03 NOTE — Telephone Encounter (Signed)
"  I'm sorry for the problem I am causing.  I need to reschedule my surgery again.  I thought I had my wife's schedule down packed but apparently I did not.  Does Dr. Charlsie Merles have anything available on May 29?"  Yes, May 29 is available.  I will get your appointment rescheduled to that date.  I called and left a message for Aram Beecham at [facility] to reschedule patient's surgery form 07/29/2016 to 08/12/2016.

## 2016-07-21 DIAGNOSIS — K219 Gastro-esophageal reflux disease without esophagitis: Secondary | ICD-10-CM | POA: Diagnosis not present

## 2016-07-21 DIAGNOSIS — Z1389 Encounter for screening for other disorder: Secondary | ICD-10-CM | POA: Diagnosis not present

## 2016-07-21 DIAGNOSIS — I7389 Other specified peripheral vascular diseases: Secondary | ICD-10-CM | POA: Diagnosis not present

## 2016-07-31 MED FILL — PHENTERMINE 37.5 MG TABLET: 37.5 | 30 days supply | Qty: 30 | Fill #1

## 2016-08-12 ENCOUNTER — Encounter: Payer: Self-pay | Admitting: Podiatry

## 2016-08-12 DIAGNOSIS — M722 Plantar fascial fibromatosis: Secondary | ICD-10-CM | POA: Diagnosis not present

## 2016-08-12 DIAGNOSIS — K219 Gastro-esophageal reflux disease without esophagitis: Secondary | ICD-10-CM | POA: Diagnosis not present

## 2016-08-12 DIAGNOSIS — M779 Enthesopathy, unspecified: Secondary | ICD-10-CM | POA: Diagnosis not present

## 2016-08-12 MED FILL — HYDROCODON-APAP 10-325: 10-325 | 4 days supply | Qty: 20 | Fill #0

## 2016-08-15 NOTE — Progress Notes (Signed)
DOS 05.29.2018 Endoscopic release of entire band right plantar fascia, injection left 2nd MPJ.

## 2016-08-18 ENCOUNTER — Other Ambulatory Visit: Payer: 59

## 2016-08-20 ENCOUNTER — Encounter: Payer: Self-pay | Admitting: Podiatry

## 2016-08-20 ENCOUNTER — Ambulatory Visit (INDEPENDENT_AMBULATORY_CARE_PROVIDER_SITE_OTHER): Payer: 59 | Admitting: Podiatry

## 2016-08-20 DIAGNOSIS — M722 Plantar fascial fibromatosis: Secondary | ICD-10-CM | POA: Diagnosis not present

## 2016-08-20 DIAGNOSIS — Z1389 Encounter for screening for other disorder: Secondary | ICD-10-CM | POA: Diagnosis not present

## 2016-08-20 NOTE — Progress Notes (Signed)
Subjective:    Patient ID: [name], male   DOB: 36 y.o.   MRN: [id number]   HPI patient presents with well-healing surgical sites medial lateral right heel with wound edges coapted well and minimal plantar pain    ROS      Objective:  Physical Exam neurovascular status intact negative Homans sign was noted with well healed surgical sites bilateral     Assessment:    Doing well post endoscopic release heel right     Plan:   Advised on anti-inflammatories and continued boot usage and reapplied sterile dressing today

## 2016-09-01 ENCOUNTER — Ambulatory Visit (INDEPENDENT_AMBULATORY_CARE_PROVIDER_SITE_OTHER): Payer: 59 | Admitting: Podiatry

## 2016-09-01 DIAGNOSIS — M722 Plantar fascial fibromatosis: Secondary | ICD-10-CM | POA: Diagnosis not present

## 2016-09-01 NOTE — Progress Notes (Signed)
Subjective:    Patient ID: Steven ImEdward R Casey, male   DOB: 36 y.o.   MRN: [id number]   HPI patient states that his heel is feeling pretty good and he is back to work full-time    ROS      Objective:  Physical Exam neurovascular status intact with patient found to have mild dorsal discomfort but overall the foot healing very well with negative Homans sign minimal changes noted and wound edges well coapted     Assessment:   Doing well post endoscopic surgery      Plan:   Advised this patient on continuation of immobilization with gradual weightbearing the next 2 weeks and dispensed ankle compression stocking after stitch removal. Reappoint for us to recheck

## 2016-09-03 MED FILL — OMEPRAZOLE DR 40 MG CAPSULE: 40 | 30 days supply | Qty: 60 | Fill #1 | Status: TO

## 2016-09-03 MED FILL — PHENTERMINE 37.5 MG TABLET: 37.5 | 30 days supply | Qty: 30 | Fill #0

## 2016-10-24 MED FILL — PHENTERMINE 37.5 MG TABLET: 37.5 | 30 days supply | Qty: 30 | Fill #0

## 2016-12-08 MED FILL — PHENTERMINE 37.5 MG TABLET: 37.5 | 30 days supply | Qty: 30 | Fill #0 | Status: TO
